# Patient Record
Sex: Female | Born: 1985 | Race: Black or African American | Hispanic: No | Marital: Single | Smoking: Current every day smoker
Health system: Southern US, Community
[De-identification: ages and names within clinical notes are randomized; demographics above are authoritative.]

---
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
## 2007-01-16 ENCOUNTER — Emergency Department (HOSPITAL_COMMUNITY): Admission: EM | Admit: 2007-01-16 | Discharge: 2007-01-16 | Payer: Self-pay | Admitting: Emergency Medicine

## 2007-04-17 ENCOUNTER — Inpatient Hospital Stay (HOSPITAL_COMMUNITY): Admission: AD | Admit: 2007-04-17 | Discharge: 2007-04-17 | Payer: Self-pay | Admitting: Obstetrics & Gynecology

## 2009-01-18 ENCOUNTER — Ambulatory Visit (HOSPITAL_COMMUNITY): Admission: RE | Admit: 2009-01-18 | Discharge: 2009-01-18 | Payer: Self-pay | Admitting: Obstetrics & Gynecology

## 2009-02-11 ENCOUNTER — Inpatient Hospital Stay (HOSPITAL_COMMUNITY): Admission: AD | Admit: 2009-02-11 | Discharge: 2009-02-14 | Payer: Self-pay | Admitting: Obstetrics & Gynecology

## 2011-04-16 LAB — URINALYSIS, ROUTINE W REFLEX MICROSCOPIC
Bilirubin Urine: NEGATIVE
Glucose, UA: NEGATIVE mg/dL
Hgb urine dipstick: NEGATIVE
Ketones, ur: NEGATIVE mg/dL
pH: 7 (ref 5.0–8.0)

## 2011-04-16 LAB — CBC
HCT: 27 % — ABNORMAL LOW (ref 36.0–46.0)
HCT: 29.7 % — ABNORMAL LOW (ref 36.0–46.0)
Hemoglobin: 10 g/dL — ABNORMAL LOW (ref 12.0–15.0)
MCV: 90.3 fL (ref 78.0–100.0)
RBC: 2.99 MIL/uL — ABNORMAL LOW (ref 3.87–5.11)
RBC: 3.3 MIL/uL — ABNORMAL LOW (ref 3.87–5.11)
WBC: 8.9 10*3/uL (ref 4.0–10.5)

## 2011-05-14 NOTE — Discharge Summary (Signed)
Christina Boone, Christina Boone               ACCOUNT NO.:  [id number]   MEDICAL RECORD NO.:  [id number]          PATIENT TYPE:  INP   LOCATION:  9104                          FACILITY:  [facility]   PHYSICIAN:  Roseanna Rainbow, M.D.DATE OF BIRTH:  08/18/1986   DATE OF ADMISSION:  02/11/2009  DATE OF DISCHARGE:  02/14/2009                               DISCHARGE SUMMARY   CHIEF COMPLAINT:  The patient is a 25 year old para 1 with an estimated  date of confinement of February 17, 2009, with an intrauterine pregnancy  at 77 plus weeks with a history of previous cesarean delivery, declines  a trial of labor, complaining of contractions.   HISTORY OF PRESENT ILLNESS:  Please see the above.   ALLERGIES:  No known drug allergies.   MEDICATIONS:  Please see the medication reconciliation form.   OB RISK FACTORS:  Please see the above.   PRENATAL LABS:  Blood type AB positive, antibody screen negative,  chlamydia probe negative.  Urine culture and sensitivity insignificant  growth.  GC probe negative, 1-hour GTT 96.  Hepatitis B surface antigen  negative, hematocrit 28.9, hemoglobin 9.4, HIV nonreactive.  Platelets  341,000, rubella immune.  Sickle cell negative, varicella immune,  Trichomonas vulvovaginal infection in January 2010 treated.   PAST OBSTETRICAL HISTORY:  In August 2008, she was delivered of a live  born female 4 pounds 13 ounces at 37 weeks.  An ultrasound on January 20,  normal amniotic fluid index, anterior placenta, no previa.  The  estimated fetal weight percentile was at the 28th percentile at 35 weeks  5 days.   PAST GYN HISTORY:  There is a history of chlamydia.   PAST MEDICAL HISTORY:  Migraine headaches.   PAST SURGICAL HISTORY:  Please see the above.   SOCIAL HISTORY:  She is unemployed, single, does not give any  significant history of alcohol usage, has no significant smoking  history.  There is a history of marijuana use.   FAMILY HISTORY:  Remarkable for adult  onset diabetes.   PHYSICAL EXAMINATION:  VITAL SIGNS:  Stable, afebrile.  Fetal heart  tracing reassuring.  Tocodynamometer irregular uterine contractions.  GENERAL:  Mild distress.  ABDOMEN:  Gravid.  Sterile vaginal exam per the RN.   ASSESSMENT:  Intrauterine pregnancy at term with a history of previous  cesarean delivery, threatened labor.  The patient declines a trial of  labor.   PLAN:  Admission, repeat cesarean delivery.   HOSPITAL COURSE:  The patient was admitted.  She underwent a repeat  cesarean delivery.  Please see the dictated operative summary.  On  postoperative day #1, her hemoglobin was 9.1.  Social Work was consulted  regarding a positive urine drug screen for the baby.  The urine drug  screen was positive for THC.  The Social Work consult had also been  requested regarding assistance with guardianship issues.  The patient's  mother is going to have a guardianship of the infant.  There is also a  history of anxiety and depression.  Per the Social Work evaluation, the  patient did not manifest any symptoms  at present and was coping well  with the recent delivery.  The remainder of her postoperative course was  uneventful.  She was discharged to home on postoperative day #3  tolerating a regular diet.   DISCHARGE DIAGNOSES:  Intrauterine pregnancy at term, early labor,  history of previous cesarean delivery, declines trial of labor.   PROCEDURE:  Repeat cesarean delivery.   CONDITION:  Stable.   DIET:  Regular.   ACTIVITY:  Progressive activity, pelvic rest.   Medications include Percocet and ibuprofen.   DISPOSITION:  The patient was to call the office to schedule  circumcision if desired.  The patient was also to follow up in the  office in 2 weeks for an incision check and postpartum followup.      Roseanna Rainbow, M.D.  Electronically Signed     LAJ/MEDQ  D:  02/14/2009  T:  02/14/2009  Job:  [id number]

## 2011-05-14 NOTE — Op Note (Signed)
Christina Boone, Christina Boone               ACCOUNT NO.:  [id number]   MEDICAL RECORD NO.:  [id number]          PATIENT TYPE:  INP   LOCATION:  9104                          FACILITY:  [facility]   PHYSICIAN:  Kathreen Cosier, M.D.DATE OF BIRTH:  [birth year]   DATE OF PROCEDURE:  02/11/2009  DATE OF DISCHARGE:                               OPERATIVE REPORT   PREOPERATIVE DIAGNOSIS:  Previous cesarean section at term in early  labor.   SURGEON:  Kathreen Cosier, MD   ANESTHESIA:  Spinal.   PROCEDURE:  The patient was placed on the operating table in supine  position after the spinal administered.  Abdomen prepped and draped.  Bladder emptied with a Foley catheter.  Transverse suprapubic incision  made through old scar, carried down to the rectus fascia.  Fascia  cleanly incised at the length of the incision.  Recti muscles retracted  laterally.  Peritoneum incised longitudinally.  Transverse incision was  made in the visceral peritoneum above the bladder.  Bladder mobilized  inferiorly.  Transverse lower uterine incision was made.  The patient  delivered from the OP position of a female, Apgar 9 and 9, weighing 6  pounds 6 ounces.  The fluid was clear.  Team was in attendance.  Placenta was anterior and removed manually, and sent to Labor and  Delivery.  Uterine cavity cleaned with dry laps.  Uterine incision was  closed in one layer with continuous suture of #1 chromic.  Hemostasis  was satisfactory.  Bladder flap was reattached with 2-0 chromic.  Uterus  well contracted.  Tubes and ovaries normal.  Abdomen closed in layers,  peritoneum continuous suture of 0 chromic, fascia continuous suture of 0  Dexon, and the skin closed with staples.   BLOOD LOSS:  700 mL.           ______________________________  Kathreen Cosier, M.D.     BAM/MEDQ  D:  02/11/2009  T:  02/11/2009  Job:  [id number]

## 2012-04-10 ENCOUNTER — Emergency Department (HOSPITAL_COMMUNITY)
Admission: EM | Admit: 2012-04-10 | Discharge: 2012-04-11 | Discharge status: Against Medical Advice | Payer: Self-pay | Attending: Emergency Medicine | Admitting: Emergency Medicine

## 2012-04-10 DIAGNOSIS — R141 Gas pain: Secondary | ICD-10-CM | POA: Insufficient documentation

## 2012-04-10 DIAGNOSIS — R143 Flatulence: Secondary | ICD-10-CM | POA: Insufficient documentation

## 2012-04-10 DIAGNOSIS — R110 Nausea: Secondary | ICD-10-CM | POA: Insufficient documentation

## 2012-04-10 DIAGNOSIS — R142 Eructation: Secondary | ICD-10-CM | POA: Insufficient documentation

## 2012-04-10 DIAGNOSIS — R51 Headache: Secondary | ICD-10-CM | POA: Insufficient documentation

## 2012-04-10 LAB — URINALYSIS, ROUTINE W REFLEX MICROSCOPIC
Bilirubin Urine: NEGATIVE
Glucose, UA: NEGATIVE mg/dL
Hgb urine dipstick: NEGATIVE
Nitrite: NEGATIVE
Specific Gravity, Urine: 1.031 — ABNORMAL HIGH (ref 1.005–1.030)
pH: 6 (ref 5.0–8.0)

## 2012-04-10 LAB — URINE MICROSCOPIC-ADD ON

## 2012-04-10 NOTE — ED Notes (Signed)
Pt reports no menstrual cycle in 4 months, reports abd bloating, nausea, and headaches.

## 2012-04-10 NOTE — ED Notes (Signed)
Unable to locate patient on nurse rounding

## 2012-04-10 NOTE — ED Notes (Signed)
Unable to locate patient.

## 2014-11-11 ENCOUNTER — Encounter (HOSPITAL_COMMUNITY): Payer: Self-pay | Admitting: Emergency Medicine

## 2014-11-11 ENCOUNTER — Emergency Department (HOSPITAL_COMMUNITY): Payer: No Typology Code available for payment source

## 2014-11-11 ENCOUNTER — Emergency Department (HOSPITAL_COMMUNITY)
Admission: EM | Admit: 2014-11-11 | Discharge: 2014-11-11 | Disposition: A | Discharge status: Home / Self Care | Payer: No Typology Code available for payment source | Attending: Emergency Medicine | Admitting: Emergency Medicine

## 2014-11-11 DIAGNOSIS — Z720 Tobacco use: Secondary | ICD-10-CM | POA: Insufficient documentation

## 2014-11-11 DIAGNOSIS — S79912A Unspecified injury of left hip, initial encounter: Secondary | ICD-10-CM | POA: Insufficient documentation

## 2014-11-11 DIAGNOSIS — Y9389 Activity, other specified: Secondary | ICD-10-CM | POA: Insufficient documentation

## 2014-11-11 DIAGNOSIS — S199XXA Unspecified injury of neck, initial encounter: Secondary | ICD-10-CM | POA: Insufficient documentation

## 2014-11-11 DIAGNOSIS — S4992XA Unspecified injury of left shoulder and upper arm, initial encounter: Secondary | ICD-10-CM | POA: Insufficient documentation

## 2014-11-11 DIAGNOSIS — S0083XA Contusion of other part of head, initial encounter: Secondary | ICD-10-CM

## 2014-11-11 DIAGNOSIS — Y92410 Unspecified street and highway as the place of occurrence of the external cause: Secondary | ICD-10-CM | POA: Insufficient documentation

## 2014-11-11 DIAGNOSIS — R52 Pain, unspecified: Secondary | ICD-10-CM

## 2014-11-11 DIAGNOSIS — Y998 Other external cause status: Secondary | ICD-10-CM | POA: Insufficient documentation

## 2014-11-11 MED ORDER — OXYCODONE-ACETAMINOPHEN 5-325 MG PO TABS
1.0000 | ORAL_TABLET | Freq: Once | ORAL | Status: AC
  Administered 2014-11-11: 1 via ORAL
  Filled 2014-11-11: qty 1

## 2014-11-11 MED ORDER — CYCLOBENZAPRINE HCL 10 MG PO TABS: 10.0000 mg | ORAL_TABLET | Freq: Two times a day (BID) | ORAL | Status: AC | PRN

## 2014-11-11 MED ORDER — OXYCODONE-ACETAMINOPHEN 5-325 MG PO TABS: 1.0000 | ORAL_TABLET | Freq: Four times a day (QID) | ORAL | Status: DC | PRN
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
## 2015-02-10 ENCOUNTER — Encounter (HOSPITAL_COMMUNITY): Payer: Self-pay

## 2015-02-10 ENCOUNTER — Emergency Department (HOSPITAL_COMMUNITY)
Admission: EM | Admit: 2015-02-10 | Discharge: 2015-02-10 | Disposition: A | Discharge status: Home / Self Care | Payer: Self-pay | Attending: Emergency Medicine | Admitting: Emergency Medicine

## 2015-02-10 DIAGNOSIS — R112 Nausea with vomiting, unspecified: Secondary | ICD-10-CM | POA: Insufficient documentation

## 2015-02-10 DIAGNOSIS — R1084 Generalized abdominal pain: Secondary | ICD-10-CM | POA: Insufficient documentation

## 2015-02-10 DIAGNOSIS — Z720 Tobacco use: Secondary | ICD-10-CM | POA: Insufficient documentation

## 2015-02-10 DIAGNOSIS — Z3202 Encounter for pregnancy test, result negative: Secondary | ICD-10-CM | POA: Insufficient documentation

## 2015-02-10 LAB — COMPREHENSIVE METABOLIC PANEL
ALBUMIN: 4.5 g/dL (ref 3.5–5.2)
ALT: 22 U/L (ref 0–35)
AST: 23 U/L (ref 0–37)
Alkaline Phosphatase: 89 U/L (ref 39–117)
Anion gap: 8 (ref 5–15)
BUN: 18 mg/dL (ref 6–23)
CHLORIDE: 105 mmol/L (ref 96–112)
CO2: 25 mmol/L (ref 19–32)
Calcium: 9.8 mg/dL (ref 8.4–10.5)
Creatinine, Ser: 0.74 mg/dL (ref 0.50–1.10)
GFR calc Af Amer: >90 mL/min (ref >90)
GFR calc non Af Amer: >90 mL/min (ref >90)
Glucose, Bld: 107 mg/dL — ABNORMAL HIGH (ref 70–99)
POTASSIUM: 3.7 mmol/L (ref 3.5–5.1)
Sodium: 138 mmol/L (ref 135–145)
Total Bilirubin: 0.6 mg/dL (ref 0.3–1.2)
Total Protein: 8.5 g/dL — ABNORMAL HIGH (ref 6.0–8.3)

## 2015-02-10 LAB — LIPASE, BLOOD: Lipase: 38 U/L (ref 11–59)

## 2015-02-10 LAB — CBC WITH DIFFERENTIAL/PLATELET
BASOS PCT: 0 % (ref 0–1)
Basophils Absolute: 0 10*3/uL (ref 0.0–0.1)
EOS PCT: 1 % (ref 0–5)
Eosinophils Absolute: 0 10*3/uL (ref 0.0–0.7)
HCT: 40.6 % (ref 36.0–46.0)
HEMOGLOBIN: 13.4 g/dL (ref 12.0–15.0)
LYMPHS PCT: 28 % (ref 12–46)
Lymphs Abs: 2 10*3/uL (ref 0.7–4.0)
MCH: 29.2 pg (ref 26.0–34.0)
MCHC: 33 g/dL (ref 30.0–36.0)
MCV: 88.5 fL (ref 78.0–100.0)
MONO ABS: 0.4 10*3/uL (ref 0.1–1.0)
Monocytes Relative: 5 % (ref 3–12)
NEUTROS ABS: 4.7 10*3/uL (ref 1.7–7.7)
NEUTROS PCT: 66 % (ref 43–77)
Platelets: 340 10*3/uL (ref 150–400)
RBC: 4.59 MIL/uL (ref 3.87–5.11)
RDW: 14.6 % (ref 11.5–15.5)
WBC: 7.1 10*3/uL (ref 4.0–10.5)

## 2015-02-10 LAB — URINALYSIS, ROUTINE W REFLEX MICROSCOPIC
BILIRUBIN URINE: NEGATIVE
Glucose, UA: NEGATIVE mg/dL
HGB URINE DIPSTICK: NEGATIVE
Ketones, ur: NEGATIVE mg/dL
Leukocytes, UA: NEGATIVE
Nitrite: NEGATIVE
PROTEIN: NEGATIVE mg/dL
Specific Gravity, Urine: 1.029 (ref 1.005–1.030)
UROBILINOGEN UA: 1 mg/dL (ref 0.0–1.0)
pH: 8 (ref 5.0–8.0)

## 2015-02-10 LAB — POC URINE PREG, ED: PREG TEST UR: NEGATIVE

## 2015-02-10 LAB — URINE MICROSCOPIC-ADD ON

## 2015-02-10 MED ORDER — ONDANSETRON HCL 4 MG/2ML IJ SOLN
4.0000 mg | Freq: Once | INTRAMUSCULAR | Status: AC
  Administered 2015-02-10: 4 mg via INTRAVENOUS
  Filled 2015-02-10: qty 2

## 2015-02-10 MED ORDER — HYDROMORPHONE HCL 1 MG/ML IJ SOLN
0.5000 mg | Freq: Once | INTRAMUSCULAR | Status: AC
  Administered 2015-02-10: 0.5 mg via INTRAVENOUS
  Filled 2015-02-10: qty 1

## 2015-02-10 MED ORDER — GI COCKTAIL ~~LOC~~
30.0000 mL | Freq: Once | ORAL | Status: AC
  Administered 2015-02-10: 30 mL via ORAL
  Filled 2015-02-10: qty 30

## 2015-02-10 MED ORDER — OXYCODONE-ACETAMINOPHEN 5-325 MG PO TABS
1.0000 | ORAL_TABLET | Freq: Four times a day (QID) | ORAL | Status: AC | PRN
Start: 2015-02-10 — End: ?

## 2015-02-10 MED ORDER — SODIUM CHLORIDE 0.9 % IV BOLUS (SEPSIS)
1000.0000 mL | INTRAVENOUS | Status: AC
Start: 2015-02-10 — End: 2015-02-10
  Administered 2015-02-10: 1000 mL via INTRAVENOUS

## 2015-02-10 MED ORDER — ONDANSETRON 4 MG PO TBDP: ORAL_TABLET | ORAL | Status: AC

## 2015-02-10 MED ORDER — OXYCODONE-ACETAMINOPHEN 5-325 MG PO TABS
1.0000 | ORAL_TABLET | Freq: Once | ORAL | Status: AC
  Administered 2015-02-10: 1 via ORAL
  Filled 2015-02-10: qty 1

## 2015-02-10 MED ORDER — ONDANSETRON 4 MG PO TBDP
4.0000 mg | ORAL_TABLET | Freq: Once | ORAL | Status: AC
  Administered 2015-02-10: 4 mg via ORAL
  Filled 2015-02-10: qty 1

## 2015-02-10 NOTE — Discharge Instructions (Signed)
Abdominal Pain, Women °Abdominal (stomach, pelvic, or belly) pain can be caused by many things. It is important to tell your doctor: °· The location of the pain. °· Does it come and go or is it present all the time? °· Are there things that start the pain (eating certain foods, exercise)? °· Are there other symptoms associated with the pain (fever, nausea, vomiting, diarrhea)? °All of this is helpful to know when trying to find the cause of the pain. °CAUSES  °· Stomach: virus or bacteria infection, or ulcer. °· Intestine: appendicitis (inflamed appendix), regional ileitis (Crohn's disease), ulcerative colitis (inflamed colon), irritable bowel syndrome, diverticulitis (inflamed diverticulum of the colon), or cancer of the stomach or intestine. °· Gallbladder disease or stones in the gallbladder. °· Kidney disease, kidney stones, or infection. °· Pancreas infection or cancer. °· Fibromyalgia (pain disorder). °· Diseases of the female organs: °¨ Uterus: fibroid (non-cancerous) tumors or infection. °¨ Fallopian tubes: infection or tubal pregnancy. °¨ Ovary: cysts or tumors. °¨ Pelvic adhesions (scar tissue). °¨ Endometriosis (uterus lining tissue growing in the pelvis and on the pelvic organs). °¨ Pelvic congestion syndrome (female organs filling up with blood just before the menstrual period). °¨ Pain with the menstrual period. °¨ Pain with ovulation (producing an egg). °¨ Pain with an IUD (intrauterine device, birth control) in the uterus. °¨ Cancer of the female organs. °· Functional pain (pain not caused by a disease, may improve without treatment). °· Psychological pain. °· Depression. °DIAGNOSIS  °Your doctor will decide the seriousness of your pain by doing an examination. °· Blood tests. °· X-rays. °· Ultrasound. °· CT scan (computed tomography, special type of X-ray). °· MRI (magnetic resonance imaging). °· Cultures, for infection. °· Barium enema (dye inserted in the large intestine, to better view it with  X-rays). °· Colonoscopy (looking in intestine with a lighted tube). °· Laparoscopy (minor surgery, looking in abdomen with a lighted tube). °· Major abdominal exploratory surgery (looking in abdomen with a large incision). °TREATMENT  °The treatment will depend on the cause of the pain.  °· Many cases can be observed and treated at home. °· Over-the-counter medicines recommended by your caregiver. °· Prescription medicine. °· Antibiotics, for infection. °· Birth control pills, for painful periods or for ovulation pain. °· Hormone treatment, for endometriosis. °· Nerve blocking injections. °· Physical therapy. °· Antidepressants. °· Counseling with a psychologist or psychiatrist. °· Minor or major surgery. °HOME CARE INSTRUCTIONS  °· Do not take laxatives, unless directed by your caregiver. °· Take over-the-counter pain medicine only if ordered by your caregiver. Do not take aspirin because it can cause an upset stomach or bleeding. °· Try a clear liquid diet (broth or water) as ordered by your caregiver. Slowly move to a bland diet, as tolerated, if the pain is related to the stomach or intestine. °· Have a thermometer and take your temperature several times a day, and record it. °· Bed rest and sleep, if it helps the pain. °· Avoid sexual intercourse, if it causes pain. °· Avoid stressful situations. °· Keep your follow-up appointments and tests, as your caregiver orders. °· If the pain does not go away with medicine or surgery, you may try: °¨ Acupuncture. °¨ Relaxation exercises (yoga, meditation). °¨ Group therapy. °¨ Counseling. °SEEK MEDICAL CARE IF:  °· You notice certain foods cause stomach pain. °· Your home care treatment is not helping your pain. °· You need stronger pain medicine. °· You want your IUD removed. °· You feel faint or   lightheaded. °· You develop nausea and vomiting. °· You develop a rash. °· You are having side effects or an allergy to your medicine. °SEEK IMMEDIATE MEDICAL CARE IF:  °· Your  pain does not go away or gets worse. °· You have a fever. °· Your pain is felt only in portions of the abdomen. The right side could possibly be appendicitis. The left lower portion of the abdomen could be colitis or diverticulitis. °· You are passing blood in your stools (bright red or black tarry stools, with or without vomiting). °· You have blood in your urine. °· You develop chills, with or without a fever. °· You pass out. °MAKE SURE YOU:  °· Understand these instructions. °· Will watch your condition. °· Will get help right away if you are not doing well or get worse. °Document Released: 10/13/2007 Document Revised: 05/02/2014 Document Reviewed: 11/02/2009 °[employer]® Patient Information ©2015 [employer]. This information is not intended to replace advice given to you by your health care provider. Make sure you discuss any questions you have with your health care provider. ° °

## 2015-02-10 NOTE — ED Provider Notes (Signed)
CSN: [id number]     Arrival date & time 02/10/15  1209 History   First MD Initiated Contact with Patient 02/10/15 1227     Chief Complaint  Patient presents with  . Abdominal Pain  . Emesis     (Consider location/radiation/quality/duration/timing/severity/associated sxs/prior Treatment) Patient is a 29 y.o. female presenting with abdominal pain and vomiting. The history is provided by the patient.  Abdominal Pain Pain location:  Generalized Pain quality: aching   Pain radiates to:  Does not radiate Pain severity:  Mild Onset quality:  Gradual Duration:  12 hours Timing:  Constant Progression:  Unchanged Chronicity:  New Context comment:  Vomiting Relieved by:  Nothing Worsened by:  Nothing tried Ineffective treatments:  None tried Associated symptoms: nausea and vomiting   Associated symptoms: no chest pain, no cough, no diarrhea, no dysuria, no fatigue, no fever, no hematuria and no shortness of breath   Nausea:    Severity:  Mild Vomiting:    Quality:  Stomach contents   Severity:  Mild   Timing:  Intermittent Emesis Associated symptoms: abdominal pain   Associated symptoms: no diarrhea and no headaches     History reviewed. No pertinent past medical history. Past Surgical History  Procedure Laterality Date  . Cesarean section classical     History reviewed. No pertinent family history. History  Substance Use Topics  . Smoking status: Current Every Day Smoker -- 0.15 packs/day    Types: Cigarettes  . Smokeless tobacco: Never Used  . Alcohol Use: No   OB History    No data available     Review of Systems  Constitutional: Negative for fever and fatigue.  HENT: Negative for congestion and drooling.   Eyes: Negative for pain.  Respiratory: Negative for cough and shortness of breath.   Cardiovascular: Negative for chest pain.  Gastrointestinal: Positive for nausea, vomiting and abdominal pain. Negative for diarrhea.  Genitourinary: Negative for dysuria and  hematuria.  Musculoskeletal: Negative for back pain, gait problem and neck pain.  Skin: Negative for color change.  Neurological: Negative for dizziness and headaches.  Hematological: Negative for adenopathy.  Psychiatric/Behavioral: Negative for behavioral problems.  All other systems reviewed and are negative.     Allergies  Review of patient's allergies indicates no known allergies.  Home Medications   Prior to Admission medications   Medication Sig Start Date End Date Taking? Authorizing Provider  cyclobenzaprine (FLEXERIL) 10 MG tablet Take 1 tablet (10 mg total) by mouth 2 (two) times daily as needed for muscle spasms. 11/11/14   Gwyneth Sprout, MD  ibuprofen (ADVIL,MOTRIN) 200 MG tablet Take 200 mg by mouth every 6 (six) hours as needed for headache (headache).    Historical Provider, MD  oxyCODONE-acetaminophen (PERCOCET/ROXICET) 5-325 MG per tablet Take 1-2 tablets by mouth every 6 (six) hours as needed for severe pain. 11/11/14   Gwyneth Sprout, MD   BP 126/73 mmHg  Pulse 85  Temp(Src) 98.4 F (36.9 C) (Oral)  Resp 18  SpO2 100%  LMP 01/18/2015 Physical Exam  Constitutional: She is oriented to person, place, and time. She appears well-developed and well-nourished.  HENT:  Head: Normocephalic.  Mouth/Throat: Oropharynx is clear and moist. No oropharyngeal exudate.  Eyes: Conjunctivae and EOM are normal. Pupils are equal, round, and reactive to light.  Neck: Normal range of motion. Neck supple.  Cardiovascular: Normal rate, regular rhythm, normal heart sounds and intact distal pulses.  Exam reveals no gallop and no friction rub.   No murmur heard. Pulmonary/Chest:  Effort normal and breath sounds normal. No respiratory distress. She has no wheezes.  Abdominal: Soft. Bowel sounds are normal. There is no tenderness. There is no rebound and no guarding.  Musculoskeletal: Normal range of motion. She exhibits no edema or tenderness.  Neurological: She is alert and  oriented to person, place, and time.  Skin: Skin is warm and dry.  Psychiatric: She has a normal mood and affect. Her behavior is normal.  Nursing note and vitals reviewed.   ED Course  Procedures (including critical care time) Labs Review Labs Reviewed  COMPREHENSIVE METABOLIC PANEL - Abnormal; Notable for the following:    Glucose, Bld 107 (*)    Total Protein 8.5 (*)    All other components within normal limits  URINALYSIS, ROUTINE W REFLEX MICROSCOPIC - Abnormal; Notable for the following:    APPearance TURBID (*)    All other components within normal limits  URINE MICROSCOPIC-ADD ON - Abnormal; Notable for the following:    Squamous Epithelial / LPF FEW (*)    Crystals TRIPLE PHOSPHATE CRYSTALS (*)    All other components within normal limits  CBC WITH DIFFERENTIAL/PLATELET  LIPASE, BLOOD  POC URINE PREG, ED    Imaging Review No results found.   EKG Interpretation None      MDM   Final diagnoses:  Nausea and vomiting, vomiting of unspecified type  Generalized abdominal pain    12:55 PM 29 y.o. female presents with vomiting and abdominal pain which began last night. She notes that she developed the vomiting first and then some abdominal soreness later. She denies any fevers. She did see a few streaks of blood in her emesis after several episodes of vomiting. She is afebrile and vital signs are unremarkable here. Her abdomen is soft with no focal tenderness. We'll get screening labs and IV fluids.  3:12 PM: Abd remains benign. Do not think pt needs imaging. Pt feeling better. VS unremarkable. I interpreted/reviewed the labs and/or imaging which were non-contributory. Will provide pain and nausea medicine. I have discussed the diagnosis/risks/treatment options with the patient and believe the pt to be eligible for discharge home to follow-up with her pcp as needed. We also discussed returning to the ED immediately if new or worsening sx occur. We discussed the sx which  are most concerning (e.g., worsening pain, fever, inc vomiting) that necessitate immediate return. Medications administered to the patient during their visit and any new prescriptions provided to the patient are listed below.  Medications given during this visit Medications  ondansetron (ZOFRAN-ODT) disintegrating tablet 4 mg (not administered)  oxyCODONE-acetaminophen (PERCOCET/ROXICET) 5-325 MG per tablet 1 tablet (not administered)  sodium chloride 0.9 % bolus 1,000 mL (0 mLs Intravenous Stopped 02/10/15 1454)  HYDROmorphone (DILAUDID) injection 0.5 mg (0.5 mg Intravenous Given 02/10/15 1312)  gi cocktail (Maalox,Lidocaine,Donnatal) (30 mLs Oral Given 02/10/15 1312)  ondansetron (ZOFRAN) injection 4 mg (4 mg Intravenous Given 02/10/15 1312)    Discharge Medication List as of 02/10/2015  3:16 PM    START taking these medications   Details  ondansetron (ZOFRAN ODT) 4 MG disintegrating tablet 4mg  ODT q4 hours prn nausea/vomit, Print         Purvis SheffieldForrest Latravion Graves, MD 02/10/15 1636

## 2015-02-10 NOTE — ED Notes (Signed)
Patient c/o mid abdominal pain and vomiting x 2 days. Patient states she saw blood in her emesis last night. Patient denies, fever, diarrhea, or dysuria.
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
## 2015-08-03 ENCOUNTER — Encounter (HOSPITAL_COMMUNITY): Payer: Self-pay | Admitting: *Deleted

## 2015-08-03 ENCOUNTER — Emergency Department (HOSPITAL_COMMUNITY)
Admission: EM | Admit: 2015-08-03 | Discharge: 2015-08-03 | Disposition: A | Discharge status: Home / Self Care | Payer: No Typology Code available for payment source | Attending: Emergency Medicine | Admitting: Emergency Medicine

## 2015-08-03 ENCOUNTER — Emergency Department (HOSPITAL_COMMUNITY): Payer: No Typology Code available for payment source

## 2015-08-03 DIAGNOSIS — S39012A Strain of muscle, fascia and tendon of lower back, initial encounter: Secondary | ICD-10-CM

## 2015-08-03 DIAGNOSIS — S3992XA Unspecified injury of lower back, initial encounter: Secondary | ICD-10-CM | POA: Diagnosis present

## 2015-08-03 DIAGNOSIS — Y998 Other external cause status: Secondary | ICD-10-CM | POA: Diagnosis not present

## 2015-08-03 DIAGNOSIS — S99922A Unspecified injury of left foot, initial encounter: Secondary | ICD-10-CM | POA: Diagnosis not present

## 2015-08-03 DIAGNOSIS — S0990XA Unspecified injury of head, initial encounter: Secondary | ICD-10-CM | POA: Insufficient documentation

## 2015-08-03 DIAGNOSIS — M79672 Pain in left foot: Secondary | ICD-10-CM

## 2015-08-03 DIAGNOSIS — Y92410 Unspecified street and highway as the place of occurrence of the external cause: Secondary | ICD-10-CM | POA: Insufficient documentation

## 2015-08-03 DIAGNOSIS — Z720 Tobacco use: Secondary | ICD-10-CM | POA: Diagnosis not present

## 2015-08-03 DIAGNOSIS — Y9389 Activity, other specified: Secondary | ICD-10-CM | POA: Diagnosis not present

## 2015-08-03 MED ORDER — NAPROXEN 500 MG PO TABS: 500.0000 mg | ORAL_TABLET | Freq: Two times a day (BID) | ORAL | Status: AC

## 2015-08-03 MED ORDER — CYCLOBENZAPRINE HCL 10 MG PO TABS: 10.0000 mg | ORAL_TABLET | Freq: Two times a day (BID) | ORAL | Status: AC | PRN

## 2015-08-03 MED ORDER — KETOROLAC TROMETHAMINE 60 MG/2ML IM SOLN
60.0000 mg | Freq: Once | INTRAMUSCULAR | Status: AC
  Administered 2015-08-03: 60 mg via INTRAMUSCULAR
  Filled 2015-08-03: qty 2

## 2015-08-03 NOTE — ED Notes (Signed)
See triage note.
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
## 2015-08-06 ENCOUNTER — Encounter (HOSPITAL_COMMUNITY): Payer: Self-pay

## 2015-08-06 ENCOUNTER — Emergency Department (HOSPITAL_COMMUNITY)
Admission: EM | Admit: 2015-08-06 | Discharge: 2015-08-06 | Disposition: A | Discharge status: Home / Self Care | Payer: No Typology Code available for payment source | Attending: Emergency Medicine | Admitting: Emergency Medicine

## 2015-08-06 DIAGNOSIS — S3991XA Unspecified injury of abdomen, initial encounter: Secondary | ICD-10-CM | POA: Diagnosis not present

## 2015-08-06 DIAGNOSIS — Y92410 Unspecified street and highway as the place of occurrence of the external cause: Secondary | ICD-10-CM | POA: Diagnosis not present

## 2015-08-06 DIAGNOSIS — Y998 Other external cause status: Secondary | ICD-10-CM | POA: Diagnosis not present

## 2015-08-06 DIAGNOSIS — Z791 Long term (current) use of non-steroidal anti-inflammatories (NSAID): Secondary | ICD-10-CM | POA: Insufficient documentation

## 2015-08-06 DIAGNOSIS — M791 Myalgia, unspecified site: Secondary | ICD-10-CM

## 2015-08-06 DIAGNOSIS — S4991XA Unspecified injury of right shoulder and upper arm, initial encounter: Secondary | ICD-10-CM | POA: Diagnosis not present

## 2015-08-06 DIAGNOSIS — Z720 Tobacco use: Secondary | ICD-10-CM | POA: Insufficient documentation

## 2015-08-06 DIAGNOSIS — Y9389 Activity, other specified: Secondary | ICD-10-CM | POA: Diagnosis not present

## 2015-08-06 DIAGNOSIS — S0990XA Unspecified injury of head, initial encounter: Secondary | ICD-10-CM | POA: Insufficient documentation

## 2015-08-06 MED ORDER — ACETAMINOPHEN 325 MG PO TABS
650.0000 mg | ORAL_TABLET | Freq: Once | ORAL | Status: AC
  Administered 2015-08-06: 650 mg via ORAL
  Filled 2015-08-06: qty 2

## 2015-08-06 MED ORDER — NAPROXEN 500 MG PO TABS
500.0000 mg | ORAL_TABLET | Freq: Two times a day (BID) | ORAL | Status: DC
  Administered 2015-08-06: 500 mg via ORAL
  Filled 2015-08-06: qty 1

## 2015-08-06 NOTE — ED Notes (Signed)
Awake. Verbally responsive. A/O x4. Resp even and unlabored. No audible adventitious breath sounds noted. ABC's intact.  

## 2015-08-06 NOTE — Discharge Instructions (Signed)
Take 4 over the counter ibuprofen tablets 3 times a day or 2 over-the-counter naproxen tablets twice a day for pain. ° °Musculoskeletal Pain °Musculoskeletal pain is muscle and boney aches and pains. These pains can occur in any part of the body. Your caregiver may treat you without knowing the cause of the pain. They may treat you if blood or urine tests, X-rays, and other tests were normal.  °CAUSES °There is often not a definite cause or reason for these pains. These pains may be caused by a type of germ (virus). The discomfort may also come from overuse. Overuse includes working out too hard when your body is not fit. Boney aches also come from weather changes. Bone is sensitive to atmospheric pressure changes. °HOME CARE INSTRUCTIONS  °· Ask when your test results will be ready. Make sure you get your test results. °· Only take over-the-counter or prescription medicines for pain, discomfort, or fever as directed by your caregiver. If you were given medications for your condition, do not drive, operate machinery or power tools, or sign legal documents for 24 hours. Do not drink alcohol. Do not take sleeping pills or other medications that may interfere with treatment. °· Continue all activities unless the activities cause more pain. When the pain lessens, slowly resume normal activities. Gradually increase the intensity and duration of the activities or exercise. °· During periods of severe pain, bed rest may be helpful. Lay or sit in any position that is comfortable. °· Putting ice on the injured area. °¨ Put ice in a bag. °¨ Place a towel between your skin and the bag. °¨ Leave the ice on for 15 to 20 minutes, 3 to 4 times a day. °· Follow up with your caregiver for continued problems and no reason can be found for the pain. If the pain becomes worse or does not go away, it may be necessary to repeat tests or do additional testing. Your caregiver may need to look further for a possible cause. °SEEK IMMEDIATE  MEDICAL CARE IF: °· You have pain that is getting worse and is not relieved by medications. °· You develop chest pain that is associated with shortness or breath, sweating, feeling sick to your stomach (nauseous), or throw up (vomit). °· Your pain becomes localized to the abdomen. °· You develop any new symptoms that seem different or that concern you. °MAKE SURE YOU:  °· Understand these instructions. °· Will watch your condition. °· Will get help right away if you are not doing well or get worse. °Document Released: 12/16/2005 Document Revised: 03/09/2012 Document Reviewed: 08/20/2013 °[employer]® Patient Information ©2015 [employer]. This information is not intended to replace advice given to you by your health care provider. Make sure you discuss any questions you have with your health care provider. ° °
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
## 2020-09-12 ENCOUNTER — Ambulatory Visit: Payer: Self-pay | Admitting: Family

## 2020-09-12 DIAGNOSIS — Z0289 Encounter for other administrative examinations: Secondary | ICD-10-CM
